# Patient Record
Sex: Male | Born: 2000 | Race: White | Hispanic: No
Health system: Southern US, Community
[De-identification: ages and names within clinical notes are randomized; demographics above are authoritative.]

## PROBLEM LIST (undated history)

## (undated) DIAGNOSIS — R011 Cardiac murmur, unspecified: Secondary | ICD-10-CM

---
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
## 2021-07-15 ENCOUNTER — Encounter (HOSPITAL_COMMUNITY): Payer: Self-pay

## 2021-07-15 ENCOUNTER — Other Ambulatory Visit: Payer: Self-pay

## 2021-07-15 ENCOUNTER — Emergency Department (HOSPITAL_COMMUNITY)
Admission: EM | Admit: 2021-07-15 | Discharge: 2021-07-15 | Disposition: A | Discharge status: Home / Self Care | Payer: Medicaid Other | Attending: Emergency Medicine | Admitting: Emergency Medicine

## 2021-07-15 ENCOUNTER — Emergency Department (HOSPITAL_COMMUNITY): Payer: Medicaid Other

## 2021-07-15 DIAGNOSIS — Z5321 Procedure and treatment not carried out due to patient leaving prior to being seen by health care provider: Secondary | ICD-10-CM | POA: Insufficient documentation

## 2021-07-15 DIAGNOSIS — R079 Chest pain, unspecified: Secondary | ICD-10-CM | POA: Insufficient documentation

## 2021-07-15 LAB — CBC WITH DIFFERENTIAL/PLATELET
Abs Immature Granulocytes: 0.01 10*3/uL (ref 0.00–0.07)
Basophils Absolute: 0 10*3/uL (ref 0.0–0.1)
Basophils Relative: 1 %
Eosinophils Absolute: 0.1 10*3/uL (ref 0.0–0.5)
Eosinophils Relative: 1 %
HCT: 41.7 % (ref 39.0–52.0)
Hemoglobin: 14.6 g/dL (ref 13.0–17.0)
Immature Granulocytes: 0 %
Lymphocytes Relative: 22 %
Lymphs Abs: 1.9 10*3/uL (ref 0.7–4.0)
MCH: 32.3 pg (ref 26.0–34.0)
MCHC: 35 g/dL (ref 30.0–36.0)
MCV: 92.3 fL (ref 80.0–100.0)
Monocytes Absolute: 0.7 10*3/uL (ref 0.1–1.0)
Monocytes Relative: 9 %
Neutro Abs: 5.6 10*3/uL (ref 1.7–7.7)
Neutrophils Relative %: 67 %
Platelets: 268 10*3/uL (ref 150–400)
RBC: 4.52 MIL/uL (ref 4.22–5.81)
RDW: 12 % (ref 11.5–15.5)
WBC: 8.4 10*3/uL (ref 4.0–10.5)
nRBC: 0 % (ref 0.0–0.2)

## 2021-07-15 LAB — COMPREHENSIVE METABOLIC PANEL
ALT: 11 U/L (ref 0–44)
AST: 15 U/L (ref 15–41)
Albumin: 3.8 g/dL (ref 3.5–5.0)
Alkaline Phosphatase: 47 U/L (ref 38–126)
Anion gap: 4 — ABNORMAL LOW (ref 5–15)
BUN: 8 mg/dL (ref 6–20)
CO2: 28 mmol/L (ref 22–32)
Calcium: 9.1 mg/dL (ref 8.9–10.3)
Chloride: 108 mmol/L (ref 98–111)
Creatinine, Ser: 0.87 mg/dL (ref 0.61–1.24)
GFR, Estimated: >60 mL/min (ref >60)
Glucose, Bld: 129 mg/dL — ABNORMAL HIGH (ref 70–99)
Potassium: 3.3 mmol/L — ABNORMAL LOW (ref 3.5–5.1)
Sodium: 140 mmol/L (ref 135–145)
Total Bilirubin: 0.3 mg/dL (ref 0.3–1.2)
Total Protein: 7.2 g/dL (ref 6.5–8.1)

## 2021-07-15 LAB — D-DIMER, QUANTITATIVE: D-Dimer, Quant: 0.42 ug/mL-FEU (ref 0.00–0.50)

## 2021-07-15 NOTE — ED Provider Triage Note (Cosign Needed)
Emergency Medicine Provider Triage Evaluation Note ? ?David Oliver , a 21 y.o. male  was evaluated in triage.  Pt complains of pain in right side chest with taking deep breath, onset last night. Denies SHOB, no pain with exertion. Does vape. ? ?Review of Systems  ?Positive: CP ?Negative: SHOB, fever ? ?Physical Exam  ?BP 140/77 (BP Location: Left Arm)   Pulse 96   Temp 98.6 ?F (37 ?C) (Oral)   Resp 16   SpO2 95%  ?Gen:   Awake, no distress   ?Resp:  Normal effort, lungs CTA ?MSK:   Moves extremities without difficulty  ?Other:  TTP mid right back near T8 ? ?Medical Decision Making  ?Medically screening exam initiated at 6:01 PM.  Appropriate orders placed.  David Oliver was informed that the remainder of the evaluation will be completed by another provider, this initial triage assessment does not replace that evaluation, and the importance of remaining in the ED until their evaluation is complete. ? ? ?  ?Tacy Learn, PA-C ?07/15/21 1803 ? ?

## 2021-07-15 NOTE — ED Triage Notes (Signed)
Patient reports R sided thoracic pain on inspiration. Denies chest pain or SHOB. ?

## 2022-04-23 DIAGNOSIS — D490 Neoplasm of unspecified behavior of digestive system: Secondary | ICD-10-CM | POA: Diagnosis not present

## 2022-04-23 DIAGNOSIS — K118 Other diseases of salivary glands: Secondary | ICD-10-CM | POA: Diagnosis not present

## 2022-05-05 DIAGNOSIS — D37030 Neoplasm of uncertain behavior of the parotid salivary glands: Secondary | ICD-10-CM | POA: Diagnosis not present

## 2022-05-05 DIAGNOSIS — K118 Other diseases of salivary glands: Secondary | ICD-10-CM | POA: Diagnosis not present

## 2022-05-05 DIAGNOSIS — D490 Neoplasm of unspecified behavior of digestive system: Secondary | ICD-10-CM | POA: Diagnosis not present

## 2022-11-20 ENCOUNTER — Emergency Department (HOSPITAL_COMMUNITY)
Admission: EM | Admit: 2022-11-20 | Discharge: 2022-11-20 | Disposition: A | Discharge status: Home / Self Care | Payer: BC Managed Care – PPO | Attending: Emergency Medicine | Admitting: Emergency Medicine

## 2022-11-20 ENCOUNTER — Other Ambulatory Visit: Payer: Self-pay

## 2022-11-20 DIAGNOSIS — M25562 Pain in left knee: Secondary | ICD-10-CM | POA: Diagnosis not present

## 2022-11-20 DIAGNOSIS — Y92410 Unspecified street and highway as the place of occurrence of the external cause: Secondary | ICD-10-CM | POA: Insufficient documentation

## 2022-11-20 DIAGNOSIS — M542 Cervicalgia: Secondary | ICD-10-CM | POA: Diagnosis not present

## 2022-11-20 MED ORDER — CYCLOBENZAPRINE HCL 10 MG PO TABS: 10.0000 mg | ORAL_TABLET | Freq: Two times a day (BID) | ORAL | 0 refills | Status: AC | PRN

## 2022-11-20 MED ORDER — IBUPROFEN 600 MG PO TABS: 600.0000 mg | ORAL_TABLET | Freq: Four times a day (QID) | ORAL | 0 refills | Status: AC | PRN
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
## 2023-11-15 NOTE — Progress Notes (Signed)
 PATIENT: David Oliver MRN:  [id number] DOB:  01-22-2001 DATE OF SERVICE:  11/15/2023  Referring Physician:  Enis Edison BRAVO, * Primary Physician:  Edison BRAVO Enis, MD  Chief Complaint:  Chief Complaint  Patient presents with  . Left Knee - Post-op    LEFT KNEE ARTHROSCOPY WITH ACL RECONSTRUCTION allograft and partial medial meniscectomy - 5.16.25    SUBJECTIVE: David Oliver is a 23 y.o. male who presents today to follow-up left knee ACL reconstruction, date of surgery Aug 27, 2023.  He had somewhat of a slow start with hesitancy to move this knee, weakness and staying in the knee immobilizer a bit longer than typical but it seems he has turned the corner a bit.  He is in therapy and reports he is progressing well with this.  Current Medications[1] Past Medical History:  Diagnosis Date  . 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 Social History   Social History Narrative  . Not on file   Allergies[2]  Review of Systems:See below for the nursing review of systems which I have reviewed, edited and corrected.  ROS   VITALS: There were no vitals taken for this visit.  PHYSICAL EXAM:  The patient is a well nourished, well developed in no acute distress. Alert, oriented/interactive. Breathing is non labored.  Examination of the involved extremity reveals the skin to be intact and there is no adenopathy.  Examination today of the left knee reveals all incisions to be well-healed.  No effusion, warmth or erythema.  He lacks about 1 degree of terminal extension which is baseline is a bit hyperextension on the contralateral side.  He flexes to 110 degrees.  He has good stability to Lachman maneuver.  No calf tenderness or distal swelling and he is neurovascularly intact.  XRAYS obtained in the office today and personally interpreted by me include none.    ASSESSMENT:  1. 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  Post-operative state       Approaching 3 months status post left ACL reconstruction  PLAN: I discussed the condition, exam and imaging studies with the patient/parents/family. I discussed treatment options.  I discussed the condition with the patient.  Discussed with him he is made significant progress since last visit.  I cautioned him that the better his knee feels the more prone people are often to do too much in terms of risky activities with ACL reconstruction.  I discussed the nature of graft healing and discussed that he should stay within the parameters as outlined by therapy for continued work on strengthening and range of motion.  He will follow-up with me in 6 weeks, earlier if needed.       This note was dictated with voice recognition software. Similar sounding words can inadvertently be transcribed and not corrected upon review.    Author:  Lonni JINNY Reilly, MD 11/18/2023 11:43 PM         [1] No current outpatient medications on file.   No current facility-administered medications for this visit.  [2] No Known Allergies

## 2023-12-12 IMAGING — CR DG CHEST 2V
2 series · 2 of 2 positions shown · non-contrast
Comparison: None.

CLINICAL DATA: Right chest pain with deep inspiration.

EXAM:
CHEST - 2 VIEW

[w chest pa]
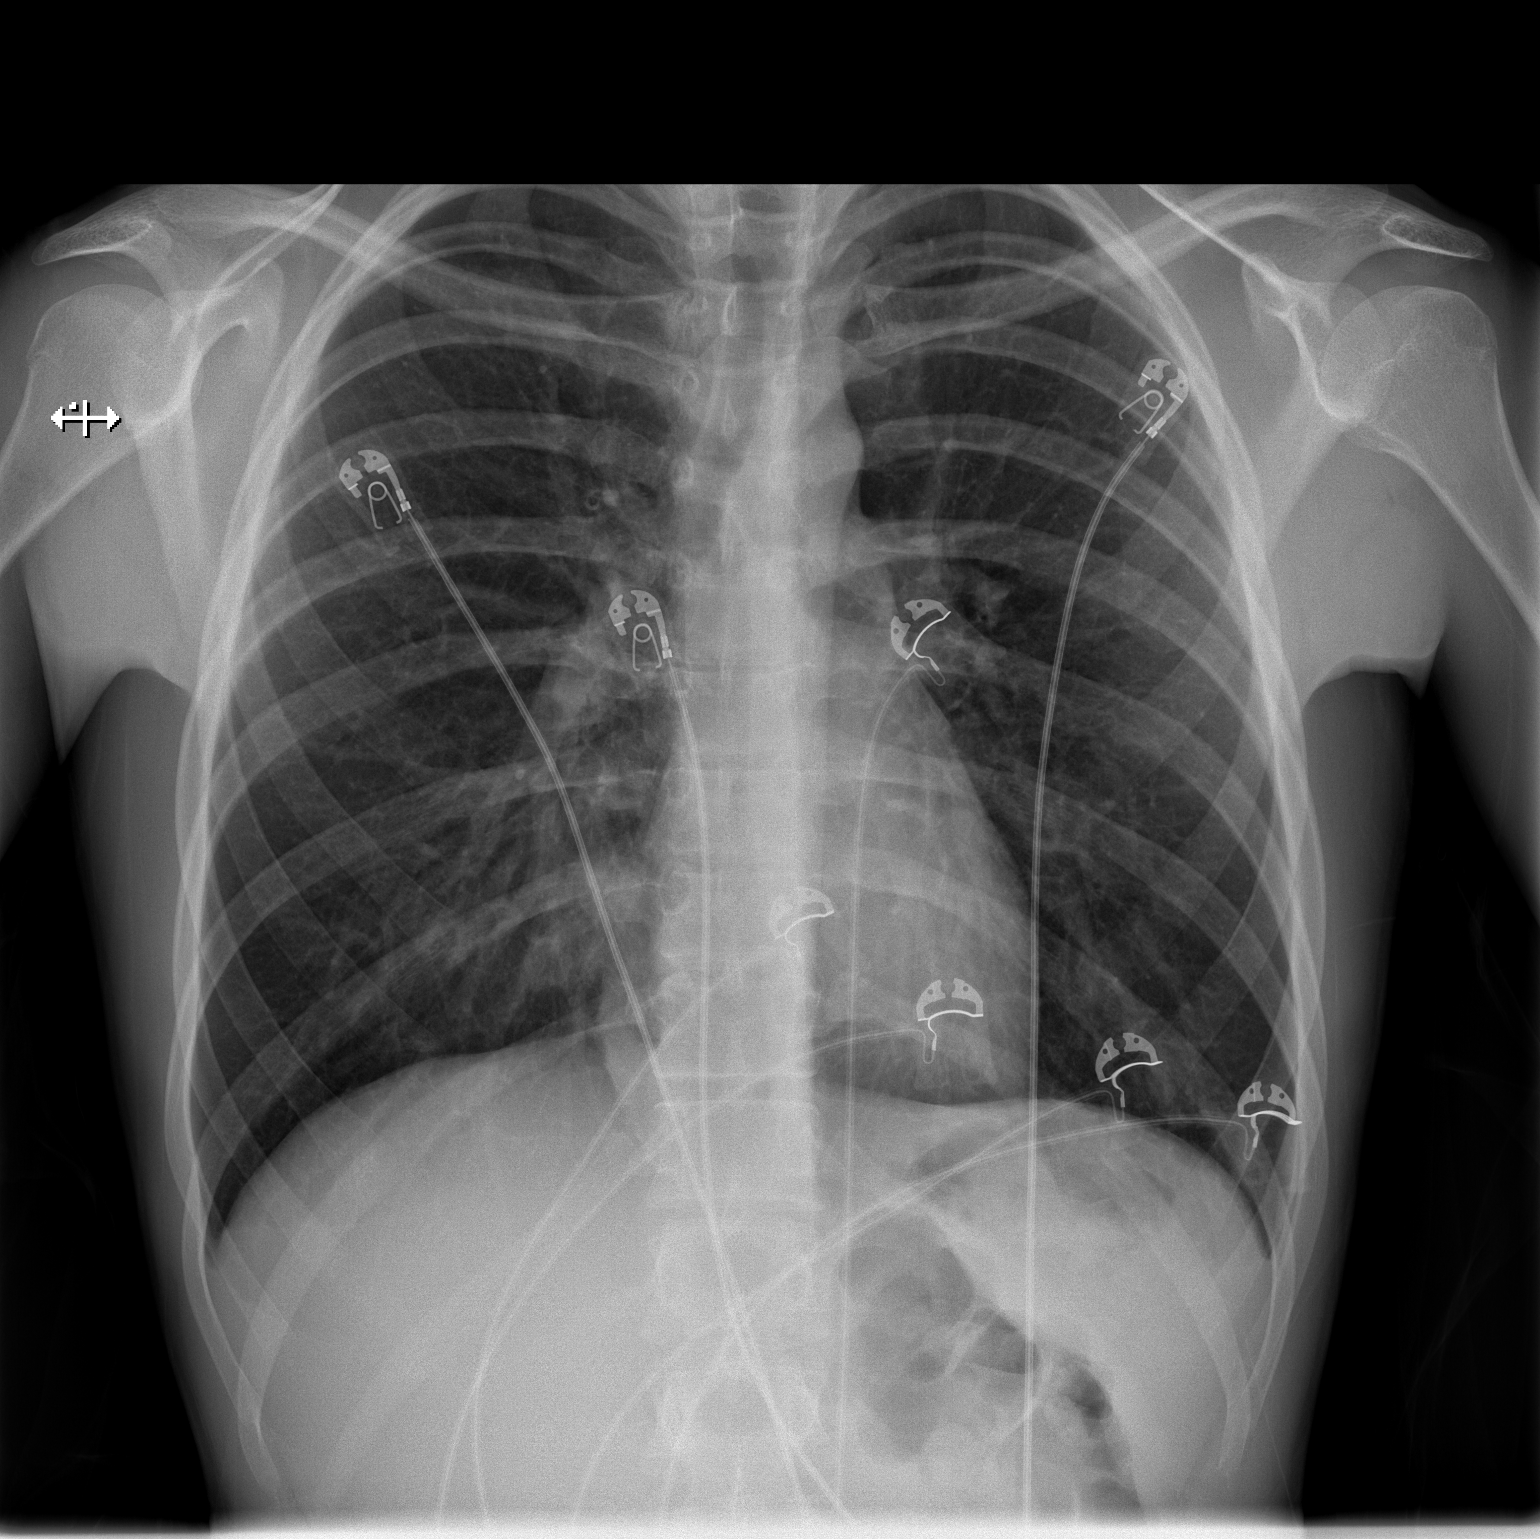

[w chest lat]
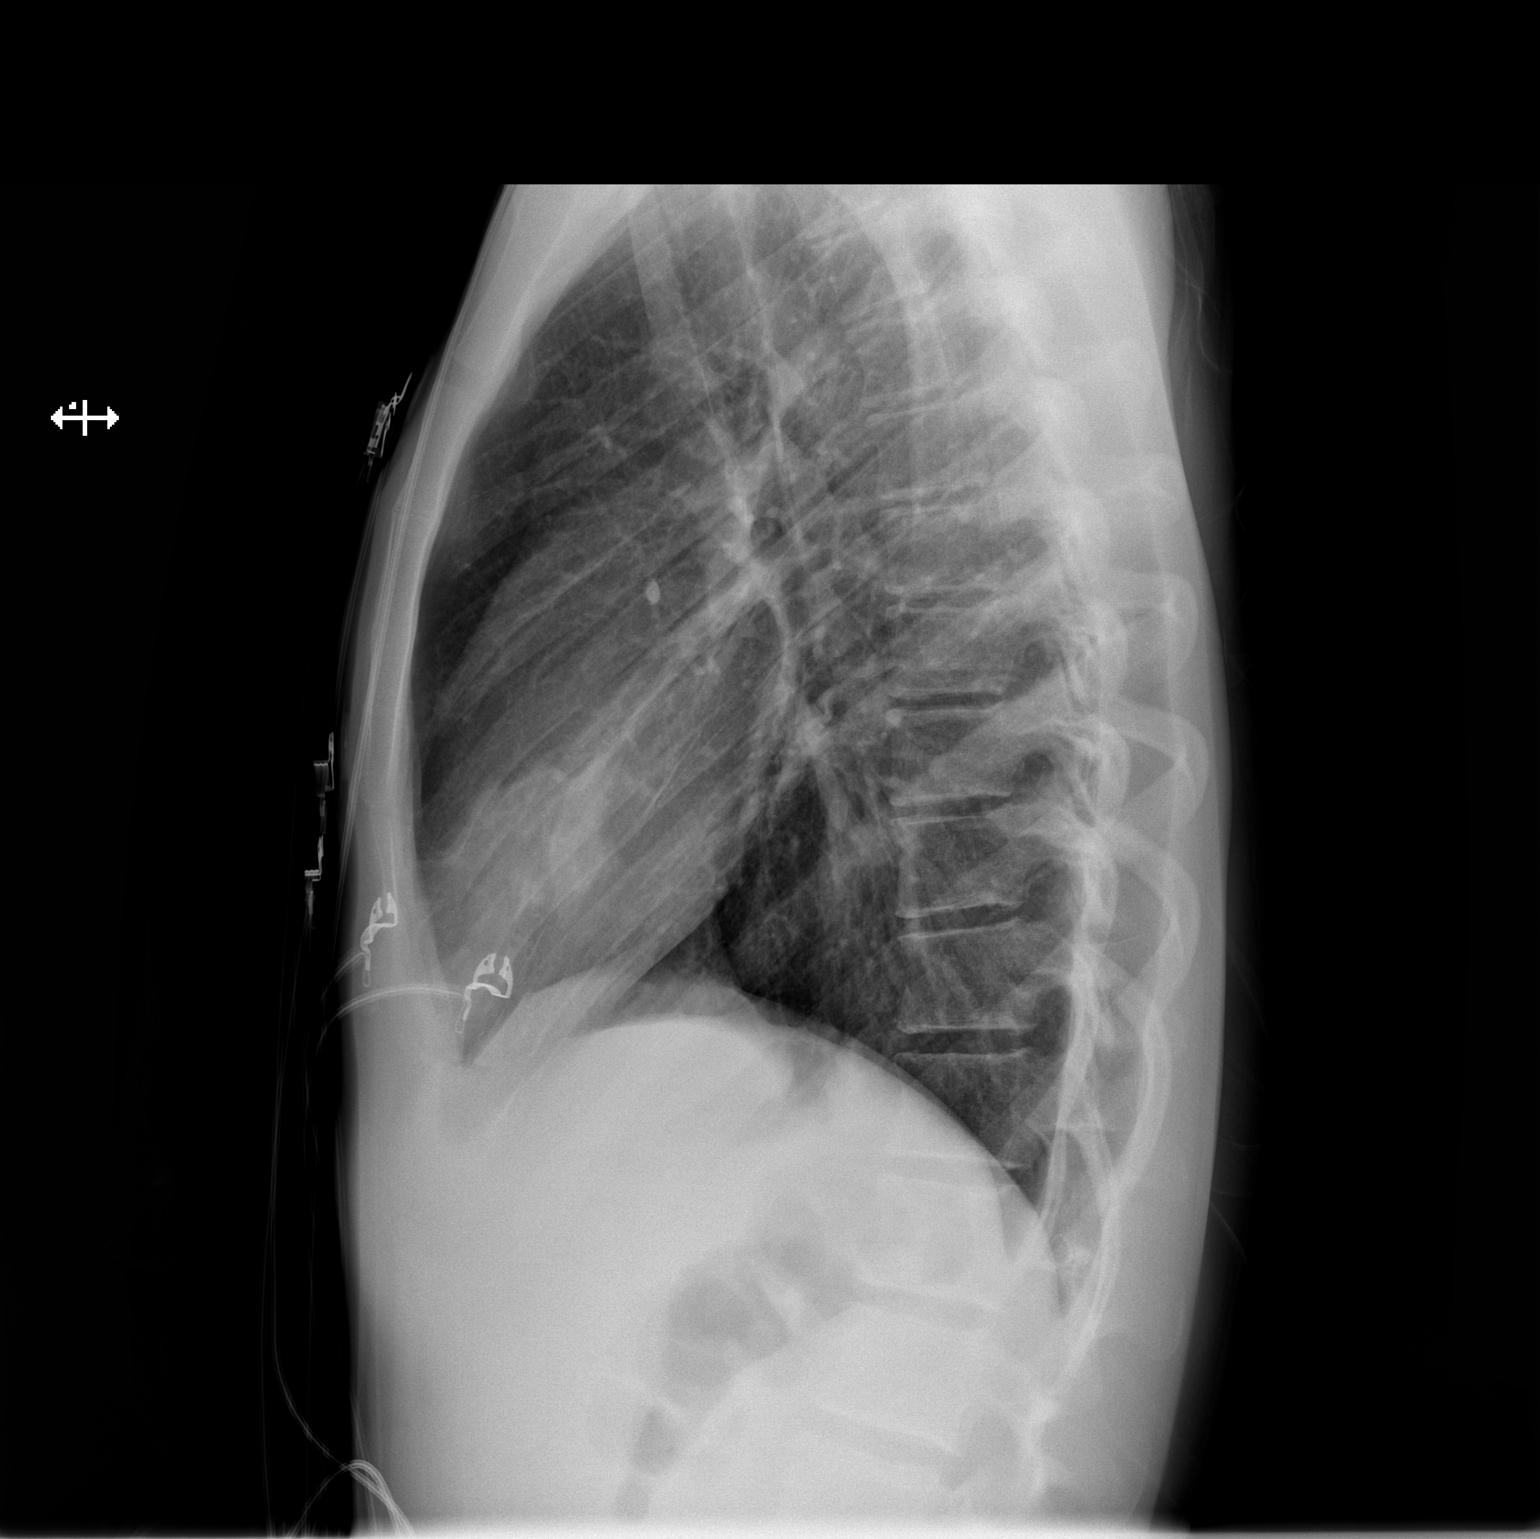

[2 of 2 positions shown; findings below may reference images not displayed]

FINDINGS: The heart size and mediastinal contours are within normal limits.
Mild central bronchial wall thickening. Right middle lobe opacity
favored to reflect atelectasis. The visualized skeletal structures
are unremarkable.
IMPRESSION: Mild central bronchial wall thickening suggesting bronchitis.

Right middle lobe opacity favored to reflect atelectasis.

## 2023-12-14 NOTE — Progress Notes (Signed)
 PT Discharge Summary Note  Patient Name:  David Oliver Date of Birth:  2000-10-19 Today's Date:  December 14, 2023   The patient was last seen in PT on 12/14/2023. They have been discharged secondary to all goals being met and meeting self made goals.    Initial functional status: Impaired  Current functional status: Improved    D/C PT Diagnosis: left knee pain, decreased ROM and strength  D/C PT Prognosis: Good  Patient Specific Functional Score (PSFS)  The PSFS is a reliable and valid outcome measurement tool which allows clinicians to determine activity limitations and functional ability. The PSFS can be used over a large number of patients with varying degrees of independence and diagnoses.  Activity 1 Score: 10 Activity 2 Score: 7 Activity 3 Score: 7 PSFS Score: 8 PSFS Score change between initial evaluation and last documented measurement: 1  Goals   Start Date: Sep 01, 2023 End date: 11/01/23 Patient Goal: to get back to work.    Pt will be independent with HEP to improve their ability to self-manage symptoms and reduce risk of recurrence.updated and reviewed, needs to gain ROM in flexion/ extension (ongoing, 12/14/23) Pt will increase BLEs strength with ability to perform 10 squats/ STS <70 deg without compensations (no knee valgus/ weight shift) to demo improvement in strength. (MET 12/14/23) Pt will increase left knee ROM to flex >/= 130 deg, ext >/= 0 deg without pain/ instability to enable performance of functional tasks and ADLs with less difficulty. (Progressing 12/14/23) Pt will report </= 1/10 or less pain with functional activity and less occurrence of sleep disturbance d/t pain. (MET 10/26/23) Pt will improve PSFS average score from 0 at initial evaluation by >2 in order to demonstrate functional improvement. (MET 10/26/23) Pt will demonstrate a normalized gait pattern with LRAD x 250' for returning to community ambulation. Ambulating w/o AD with knee flexion and  decreased heel strike. (Progressing 12/14/23)  Pt will be able to ascend/descend >/= 8 stairs recip in order to I navigate through home setting. (MET 12/14/23)   Establish new goals at 12 weeks post op per protocol.     Patient was educated on discharge: exercises, goals and prognosis. Patient is aware of discharge and will continue with their home exercise program (HEP) at this time or as instructed by therapist.  Bernice MARLA Mackintosh, PT, DPT 12/14/2023 5:06 PM  [facility] [place] [facility] [address] [place] [address] Dept: [phone] Dept Fax: [phone]

## 2023-12-14 NOTE — Progress Notes (Signed)
 Progress Note  Patient Name:  David Oliver Date of Birth:  2000/10/06 Today's Date:  December 14, 2023 Referring Provider: Brenna Lonni PARAS,* Visit #: 80 of     Precautions:       PT Diagnosis: left knee pain, decreased ROM and strength  PT Prognosis: Good Assessment:   Assessment & Plan   Assessment  Assessment details: David Oliver presents to PT for treatment s/p LEFT knee arthroscopy with ACL reconstruction with allograft and partial medial meniscectomy on 08/27/2023 by Dr. Brenna. He is over 15 weeks post-op. He has met the majority of the goals and feels that he is ready to return to work. He continues to lack knee flexion compared to his RLE. He is instructed to continue with knee flexion stretches as detailed in his HEP. Quad strength is good with improved eccentric control demonstrated during therapeutic exercises. All questions were encouraged and answered today.   The patient requires continued skilled Physical Therapy services through the use of planned therapy and modality interventions listed below in order to address the impairments mentioned above/at initial evaluation. Services to be provided are reasonable and medically necessary for the diagnosis and/or treatment of illness/injury in order to improve the function.  Services to be provided are of appropriate amount, duration and frequency within accepted standards of medical practice per the diagnosis. Upon system's review, no comorbid conditions were identified to contraindicate therapeutic interventions.    Plan for next session:  He will follow-up with Dr. Brenna in two weeks. Anticipate d/c from formal PT to independent HEP.   Therapy options:  Frequency: 2x/week tapering to 1x/week as indicated Duration of Time: 12 weeks   Planned Interventions:  Planned Interventions: Remote Therapeutic Monitoring, Cupping, Dry needling, Gait Training, Home Exercise Program, Manual Therapy IASTM, Joint Mobilizations,  Myofascial Release, STM, and Trigger Point Release , Neuromuscular Re-education, Performance Testing, Self Care/Home Management/Education, Taping, Therapeutic Activities, and Therapeutic Exercises Planned Modalities: Cryotherapy, Electrical Stimulation (Attended or Unattended), Surface EMG/Biofeedback, Thermotherapy, and Vasopneumatic Compression   Continual Care Information  Cause of Current Episode: Traumatic Rehabilitation related to post-operative or post stroke state?Yes Rehabilitation related to a diagnosis of cancer? No   Patients current clinical presentation: Moderate functional deficits or impairments Spine clinical presentation (select NA, if not a spinal diagnosis): NA    Patient/caregiver reported no cultural and/or religious practices that should be considered in patient's treatment plan. Patient or caregiver informed and in agreement with treatment plan risks and benefits.  Subjective:  Subjective Evaluation    History of Present Illness   Mechanism of injury: He has no new complaints.   Objective: There were no vitals filed for this visit. Objective  Lower Extremity ROM & Strength: 11/02/2023 Anything left blank was deferred at this time           Motion (Normal) AROM Strength Comments   Right Left Right Left           Hip:            Flexion (0-120o)     5  5    Extension (0-20o)      5 4+    Abduction (0-45o)      5 4           Knee:            Flexion (0-130o)  141 121  5 5   Extension (0o)  5+ 0  5  5      Patient Specific Functional Score: Activity 1  Score: 10 Activity 2 Score: 7 Activity 3 Score: 7 PSFS Score: 8 PSFS Score change between initial evaluation and last documented measurement: 1 Patient Specific Functional Score (PSFS)  The PSFS is a reliable and valid outcome measurement tool which allows clinicians to determine activity limitations and functional ability. The PSFS can be used over a large number of patients with varying degrees of  independence and diagnoses.  Activity 1 Score: 10 Activity 2 Score: 7 Activity 3 Score: 7 PSFS Score: 8 PSFS Score change between initial evaluation and last documented measurement: 1   Pt will improve PSFS score by > 6 in order to demonstrate functional improvement.   Treatments Performed:    Interventions Performed    Anything left blank was deferred at this time During all sensitive area treatments, patient provided continual verbal consent before and during each intervention.     Therapeutic Ex [phone]): LEFT KNEE   Seated bike  5' L4 with focus on end range knee flexion  Step flexion stretch 10x5 Slant board stretch 3x30 Shuttle LLPS 75# 5x10 Shuttle leg press 75# 2x10 B SL Shuttle Press 43# 2x10 ea Lateral Eccentric Step Downs 4 x10 L  Forward eccentric step down 4 2x10 L BOSU Squats 2x10 FWD Step Ups w/high knee BOSU (FSD) x10 L FWD Lunge on BOSU (FSD) x10 L KB Goblet Squats 15# x10 Single leg RDLs 15# x10 L   Held:  Heel raises SL 2x10 B  Standing hip abduction x10 B 5# Standing hip extension x10 B 5# Standing 3 Direction kick on Airex x10 L Lateral Step Ups+pause on BOSU (FSD) x10 L CC FWD/Retro Walking 10.5# x5, 19# x5, 25# x5,  Staggered STS LLE Posterior x10  SL Squat Eccentric 2x5 L  Therapeutic Act (02469):     Manual (02859):     Neuro Re-Ed (02887):     Gait Training [phone]):    Aquatic Therapy [phone]):  Modalities    Education:  The patient was educated regarding: therapy diagnosis, anatomy and physiology, treatment plan, rationale, therapeutic goals, expectations, and home exercise program. All patient questions were entertained and answered today.  The education provided was required by a skilled therapist through active lecturing and/or demonstration and the time spent was included in the appropriate/correlative billed codes.     HEP Provided:  Access Code: [email or web address]   Exercises - Supine Ankle Pumps  - 5 x daily - 7 x weekly - 1  sets - 30 reps - Seated Table Hamstring Stretch  - 3 x daily - 7 x weekly - 1 sets - 10 reps - 30 seconds hold - Long Sitting Calf Stretch with Strap  - 3 x daily - 7 x weekly - 1 sets - 10 reps - 30 seconds hold - Supine Quad Set  - 3 x daily - 7 x weekly - 3 sets - 10 reps - 5 seconds hold - Supine Gluteal Sets  - 3 x daily - 7 x weekly - 3 sets - 10 reps - 5 seconds hold - Supine Heel Slide with Strap  - 2 x daily - 7 x weekly - 2 sets - 10 reps - 5 seconds hold  Skilled PT required for exercise performance to ensure proper technique for continued safe & effective progression.  Each CPT code is separate and distinct therefore medically necessary. Goals   Start Date: Sep 01, 2023 End date: 11/01/23 Patient Goal: to get back to work.    Pt will be independent with HEP to  improve their ability to self-manage symptoms and reduce risk of recurrence.updated and reviewed, needs to gain ROM in flexion/ extension (ongoing, 12/14/23) Pt will increase BLEs strength with ability to perform 10 squats/ STS <70 deg without compensations (no knee valgus/ weight shift) to demo improvement in strength. (MET 12/14/23) Pt will increase left knee ROM to flex >/= 130 deg, ext >/= 0 deg without pain/ instability to enable performance of functional tasks and ADLs with less difficulty. (Progressing 12/14/23) Pt will report </= 1/10 or less pain with functional activity and less occurrence of sleep disturbance d/t pain. (MET 10/26/23) Pt will improve PSFS average score from 0 at initial evaluation by >2 in order to demonstrate functional improvement. (MET 10/26/23) Pt will demonstrate a normalized gait pattern with LRAD x 250' for returning to community ambulation. Ambulating w/o AD with knee flexion and decreased heel strike. (Progressing 12/14/23)  Pt will be able to ascend/descend >/= 8 stairs recip in order to I navigate through home setting. (MET 12/14/23)   Establish new goals at 12 weeks post op per protocol.    Treatment Time  Today's Evaluation/Treatment: 1534 - 1612 Total Time: 38 min Certification Period: 09/01/23 to 12/27/23  Date for PT Re-Evaluation:     Charges  Total Time Code Treatment Minutes: 38  PT Therapeutic Time Entry Therapeutic Exercise Time Entry: 38 (3 units)   Leonce Sayre, PTA 12/14/2023 4:13 PM  [facility] [place] [facility] [address] [place] [address] Dept: [phone] Dept Fax: [phone]

## 2024-01-25 ENCOUNTER — Emergency Department (HOSPITAL_BASED_OUTPATIENT_CLINIC_OR_DEPARTMENT_OTHER): Payer: Self-pay

## 2024-01-25 ENCOUNTER — Other Ambulatory Visit: Payer: Self-pay

## 2024-01-25 ENCOUNTER — Encounter (HOSPITAL_BASED_OUTPATIENT_CLINIC_OR_DEPARTMENT_OTHER): Payer: Self-pay | Admitting: Emergency Medicine

## 2024-01-25 ENCOUNTER — Emergency Department (HOSPITAL_BASED_OUTPATIENT_CLINIC_OR_DEPARTMENT_OTHER)
Admission: EM | Admit: 2024-01-25 | Discharge: 2024-01-25 | Disposition: A | Discharge status: Home / Self Care | Payer: Self-pay | Attending: Emergency Medicine | Admitting: Emergency Medicine

## 2024-01-25 DIAGNOSIS — Y990 Civilian activity done for income or pay: Secondary | ICD-10-CM | POA: Diagnosis not present

## 2024-01-25 DIAGNOSIS — M25562 Pain in left knee: Secondary | ICD-10-CM | POA: Insufficient documentation

## 2024-01-25 DIAGNOSIS — X501XXA Overexertion from prolonged static or awkward postures, initial encounter: Secondary | ICD-10-CM | POA: Diagnosis not present

## 2024-01-25 NOTE — Discharge Instructions (Signed)
 Admit Tylenol  or ibuprofen  as needed for pain.  Please follow-up with your orthopedist this week for further evaluation and restrictions on activity.

## 2024-01-25 NOTE — ED Triage Notes (Signed)
 Pt states woke up left knee was sore, pt went to work and had to walk up dock Plates on step felt unstable and pain. Hx of issues and has had Sx 3-4 months ago.

## 2024-01-25 NOTE — ED Provider Notes (Signed)
 Emergency Department Provider Note   I have reviewed the triage vital signs and the nursing notes.   HISTORY  Chief Complaint Knee Pain   HPI David Oliver is a 23 y.o. male past history reviewed below including previously repaired ACL in the [facility] system presents with sensation of knee instability after hyperextending it during work.  He was walking up a shallow incline when he felt his knee hyperextend slightly.  He has had some discomfort and feeling of instability since.  No falls.   Past Medical History:  Diagnosis Date   Heart murmur     Review of Systems  Constitutional: No fever/chills Cardiovascular: Denies chest pain. Musculoskeletal: Positive left knee pain.  Skin: Negative for rash. Neurological: Negative for numbness.  ____________________________________________   PHYSICAL EXAM:  VITAL SIGNS: ED Triage Vitals  Encounter Vitals Group     BP 01/25/24 0416 125/86     Pulse Rate 01/25/24 0416 76     Resp 01/25/24 0416 16     Temp 01/25/24 0416 97.7 F (36.5 C)     Temp Source 01/25/24 0416 Oral     SpO2 01/25/24 0416 99 %     Weight 01/25/24 0415 140 lb (63.5 kg)     Height 01/25/24 0415 5' 8 (1.727 m)   Constitutional: Alert and oriented. Well appearing and in no acute distress. Eyes: Conjunctivae are normal.  Head: Atraumatic. Nose: No congestion/rhinnorhea. Mouth/Throat: Mucous membranes are moist.  Neck: No stridor.   Cardiovascular: Normal rate, regular rhythm. Good peripheral circulation.  Respiratory: Normal respiratory effort.   Gastrointestinal: No distention.  Musculoskeletal: Normal range of motion of the left knee without large effusion.  No severe joint laxity. Negative anterior/posterior drawer. Compartments are soft.  Neurologic:  Normal speech and language. No gross focal neurologic deficits are appreciated.   ____________________________________________  RADIOLOGY  DG Knee Complete 4 Views Left Result Date:  01/25/2024 CLINICAL DATA:  Knee pain and soreness. EXAM: LEFT KNEE - COMPLETE 4+ VIEW COMPARISON:  06/17/2016 FINDINGS: No evidence for an acute fracture. No subluxation or dislocation. Sequelae of prior ACL repair evident. No joint effusion. 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 IMPRESSION: 1. No acute bony findings. 2. Sequelae of prior ACL repair. Electronically Signed   By: Camellia Candle M.D.   On: 01/25/2024 05:38    ____________________________________________   PROCEDURES  Procedure(s) performed:   Procedures  None  ____________________________________________   INITIAL IMPRESSION / ASSESSMENT AND PLAN / ED COURSE  Pertinent labs & imaging results that were available during my care of the patient were reviewed by me and considered in my medical decision making (see chart for details).   This patient is Presenting for Evaluation of knee pain, which does require a range of treatment options, and is a complaint that involves a moderate risk of morbidity and mortality.  The Differential Diagnoses include ACL injury, sprain, fracture, dislocation, etc.  Radiologic Tests Ordered, included knee XR. I independently interpreted the images and agree with radiology interpretation.    Medical Decision Making: Summary:  The patient presents to the emergency department with left knee pain after mild hyperextension.  Clinically does not seem consistent with full dislocation.  Plan for plain film imaging and patient will need to follow-up with orthopedics.   Reevaluation with update and discussion with patient. XR unremarkable. Will take the patient out of work pending ortho follow up.   Patient's presentation is most consistent with exacerbation of chronic illness.   Disposition: discharge  ____________________________________________  FINAL CLINICAL IMPRESSION(S) / ED DIAGNOSES  Final diagnoses:  Acute pain of left knee    Note:  This document was prepared using  Dragon voice recognition software and may include unintentional dictation errors.  Fonda Law, MD, [facility]    Shani Fitch, Fonda MATSU, MD 01/25/24 [phone]
